# Patient Record
Sex: Male | Born: 1970 | Race: Black or African American | Hispanic: No | Marital: Single | Smoking: Light tobacco smoker
Health system: Southern US, Community
[De-identification: ages and names within clinical notes are randomized; demographics above are authoritative.]

## PROBLEM LIST (undated history)

## (undated) DIAGNOSIS — E119 Type 2 diabetes mellitus without complications: Secondary | ICD-10-CM

---

## 2020-01-25 ENCOUNTER — Emergency Department (HOSPITAL_COMMUNITY)
Admission: EM | Admit: 2020-01-25 | Discharge: 2020-01-25 | Disposition: A | Discharge status: Home / Self Care | Attending: Emergency Medicine | Admitting: Emergency Medicine

## 2020-01-25 ENCOUNTER — Other Ambulatory Visit: Payer: Self-pay

## 2020-01-25 ENCOUNTER — Emergency Department (HOSPITAL_COMMUNITY)

## 2020-01-25 ENCOUNTER — Encounter (HOSPITAL_COMMUNITY): Payer: Self-pay | Admitting: Emergency Medicine

## 2020-01-25 DIAGNOSIS — R079 Chest pain, unspecified: Secondary | ICD-10-CM | POA: Diagnosis not present

## 2020-01-25 DIAGNOSIS — Z5321 Procedure and treatment not carried out due to patient leaving prior to being seen by health care provider: Secondary | ICD-10-CM | POA: Diagnosis not present

## 2020-01-25 HISTORY — DX: Type 2 diabetes mellitus without complications: E11.9

## 2020-01-25 LAB — BASIC METABOLIC PANEL
Anion gap: 13 (ref 5–15)
BUN: 9 mg/dL (ref 6–20)
CO2: 23 mmol/L (ref 22–32)
Calcium: 9.2 mg/dL (ref 8.9–10.3)
Chloride: 99 mmol/L (ref 98–111)
Creatinine, Ser: 0.93 mg/dL (ref 0.61–1.24)
GFR calc Af Amer: >60 mL/min (ref >60)
GFR calc non Af Amer: >60 mL/min (ref >60)
Glucose, Bld: 285 mg/dL — ABNORMAL HIGH (ref 70–99)
Potassium: 3.4 mmol/L — ABNORMAL LOW (ref 3.5–5.1)
Sodium: 135 mmol/L (ref 135–145)

## 2020-01-25 LAB — CBC
HCT: 44.9 % (ref 39.0–52.0)
Hemoglobin: 14.6 g/dL (ref 13.0–17.0)
MCH: 26.7 pg (ref 26.0–34.0)
MCHC: 32.5 g/dL (ref 30.0–36.0)
MCV: 82.2 fL (ref 80.0–100.0)
Platelets: 300 10*3/uL (ref 150–400)
RBC: 5.46 MIL/uL (ref 4.22–5.81)
RDW: 13.6 % (ref 11.5–15.5)
WBC: 6 10*3/uL (ref 4.0–10.5)
nRBC: 0 % (ref 0.0–0.2)

## 2020-01-25 LAB — TROPONIN I (HIGH SENSITIVITY): Troponin I (High Sensitivity): 16 ng/L (ref <18)

## 2020-01-25 MED ORDER — SODIUM CHLORIDE 0.9% FLUSH: 3.0000 mL | Freq: Once | INTRAVENOUS | Status: DC

## 2020-01-25 NOTE — ED Notes (Signed)
Called pt 3 times and no one answered.

## 2020-01-25 NOTE — ED Triage Notes (Signed)
Pt arrives to ED after walking here about 5 blocks with c/o of CP for the last 2 weeks. Pt states this was new no history. Pt states he is currently living in halfway house and wanted to get checked.

## 2020-01-25 NOTE — ED Notes (Signed)
Called.

## 2020-01-26 ENCOUNTER — Encounter (HOSPITAL_COMMUNITY): Payer: Self-pay

## 2020-01-26 ENCOUNTER — Emergency Department (HOSPITAL_COMMUNITY)
Admission: EM | Admit: 2020-01-26 | Discharge: 2020-01-26 | Disposition: A | Discharge status: Home / Self Care | Attending: Emergency Medicine | Admitting: Emergency Medicine

## 2020-01-26 ENCOUNTER — Other Ambulatory Visit: Payer: Self-pay

## 2020-01-26 DIAGNOSIS — Z7982 Long term (current) use of aspirin: Secondary | ICD-10-CM | POA: Insufficient documentation

## 2020-01-26 DIAGNOSIS — Z7984 Long term (current) use of oral hypoglycemic drugs: Secondary | ICD-10-CM | POA: Diagnosis not present

## 2020-01-26 DIAGNOSIS — R739 Hyperglycemia, unspecified: Secondary | ICD-10-CM | POA: Diagnosis present

## 2020-01-26 DIAGNOSIS — E1165 Type 2 diabetes mellitus with hyperglycemia: Secondary | ICD-10-CM | POA: Diagnosis not present

## 2020-01-26 DIAGNOSIS — F17200 Nicotine dependence, unspecified, uncomplicated: Secondary | ICD-10-CM | POA: Insufficient documentation

## 2020-01-26 DIAGNOSIS — R519 Headache, unspecified: Secondary | ICD-10-CM | POA: Insufficient documentation

## 2020-01-26 LAB — CBC
HCT: 44.5 % (ref 39.0–52.0)
Hemoglobin: 14.4 g/dL (ref 13.0–17.0)
MCH: 27.1 pg (ref 26.0–34.0)
MCHC: 32.4 g/dL (ref 30.0–36.0)
MCV: 83.6 fL (ref 80.0–100.0)
Platelets: 238 10*3/uL (ref 150–400)
RBC: 5.32 MIL/uL (ref 4.22–5.81)
RDW: 13.5 % (ref 11.5–15.5)
WBC: 4 10*3/uL (ref 4.0–10.5)
nRBC: 0 % (ref 0.0–0.2)

## 2020-01-26 LAB — BASIC METABOLIC PANEL
Anion gap: 14 (ref 5–15)
BUN: 11 mg/dL (ref 6–20)
CO2: 28 mmol/L (ref 22–32)
Calcium: 9.6 mg/dL (ref 8.9–10.3)
Chloride: 96 mmol/L — ABNORMAL LOW (ref 98–111)
Creatinine, Ser: 0.9 mg/dL (ref 0.61–1.24)
GFR calc Af Amer: >60 mL/min (ref >60)
GFR calc non Af Amer: >60 mL/min (ref >60)
Glucose, Bld: 417 mg/dL — ABNORMAL HIGH (ref 70–99)
Potassium: 4.3 mmol/L (ref 3.5–5.1)
Sodium: 138 mmol/L (ref 135–145)

## 2020-01-26 LAB — URINALYSIS, ROUTINE W REFLEX MICROSCOPIC
Bacteria, UA: NONE SEEN
Bilirubin Urine: NEGATIVE
Glucose, UA: >=500 mg/dL — AB
Hgb urine dipstick: NEGATIVE
Ketones, ur: 20 mg/dL — AB
Leukocytes,Ua: NEGATIVE
Nitrite: NEGATIVE
Protein, ur: NEGATIVE mg/dL
Specific Gravity, Urine: 1.037 — ABNORMAL HIGH (ref 1.005–1.030)
pH: 6 (ref 5.0–8.0)

## 2020-01-26 LAB — CBG MONITORING, ED
Glucose-Capillary: 474 mg/dL — ABNORMAL HIGH (ref 70–99)
Glucose-Capillary: 271 mg/dL — ABNORMAL HIGH (ref 70–99)

## 2020-01-26 MED ORDER — BLOOD GLUCOSE MONITOR KIT: PACK | 0 refills | Status: AC

## 2020-01-26 MED ORDER — ATORVASTATIN CALCIUM 20 MG PO TABS: 20.0000 mg | ORAL_TABLET | Freq: Every day | ORAL | 0 refills | Status: DC

## 2020-01-26 MED ORDER — SODIUM CHLORIDE 0.9 % IV BOLUS
1000.0000 mL | Freq: Once | INTRAVENOUS | Status: AC
  Administered 2020-01-26: 1000 mL via INTRAVENOUS

## 2020-01-26 MED ORDER — METFORMIN HCL 1000 MG PO TABS: 1000.0000 mg | ORAL_TABLET | Freq: Every day | ORAL | 0 refills | Status: DC

## 2020-01-26 MED ORDER — ASPIRIN EC 81 MG PO TBEC: 81.0000 mg | DELAYED_RELEASE_TABLET | Freq: Every day | ORAL | 30 refills | Status: AC

## 2020-01-26 MED ORDER — LISINOPRIL 20 MG PO TABS: 20.0000 mg | ORAL_TABLET | Freq: Every day | ORAL | 0 refills | Status: DC

## 2020-01-26 NOTE — ED Provider Notes (Addendum)
[name] DEPT Provider Note   CSN: [id number] Arrival date & time: 01/26/20  1356     History Chief Complaint  Patient presents with  . Hyperglycemia    Rodney Williamson is a 49 y.o. male.  Pt presents to the ED today with hazy vision, feeling thirsty, and headache.  Pt was diagnosed with DM while in prison.  He was released with meds, but does not have insurance or a pcp.  Pt does not have a glucose monitor.  Pt denies f/c.  No sob.        Past Medical History:  Diagnosis Date  . Diabetes mellitus without complication ([place])     There are no problems to display for this patient.   History reviewed. No pertinent surgical history.     History reviewed. No pertinent family history.  Social History   Tobacco Use  . Smoking status: Light Tobacco Smoker  . Smokeless tobacco: Never Used  Substance Use Topics  . Alcohol use: Not on file  . Drug use: Not on file    Home Medications Prior to Admission medications   Medication Sig Start Date End Date Taking? Authorizing Provider  aspirin EC 81 MG tablet Take 1 tablet (81 mg total) by mouth daily. Swallow whole. 01/26/20   Isla Pence, MD  atorvastatin (LIPITOR) 20 MG tablet Take 1 tablet (20 mg total) by mouth daily. 01/26/20   Isla Pence, MD  blood glucose meter kit and supplies KIT Dispense based on patient and insurance preference. Use up to four times daily as directed. (FOR ICD-9 250.00, 250.01). 01/26/20   Isla Pence, MD  lisinopril (ZESTRIL) 20 MG tablet Take 1 tablet (20 mg total) by mouth daily. 01/26/20   Isla Pence, MD  metFORMIN (GLUCOPHAGE) 1000 MG tablet Take 1 tablet (1,000 mg total) by mouth daily. 01/26/20   Isla Pence, MD    Allergies    Patient has no known allergies.  Review of Systems   Review of Systems  Endocrine: Positive for polyuria.  All other systems reviewed and are negative.   Physical Exam Updated Vital Signs BP (!) 138/84 (BP  Location: Right Arm)   Pulse 101   Temp 99.1 F (37.3 C) (Oral)   Resp 16   Ht 5' 5" (1.651 m)   Wt 81.6 kg   SpO2 100%   BMI 29.95 kg/m   Physical Exam Vitals and nursing note reviewed.  Constitutional:      Appearance: Normal appearance.  HENT:     Head: Normocephalic and atraumatic.     Right Ear: External ear normal.     Left Ear: External ear normal.     Nose: Nose normal.     Mouth/Throat:     Mouth: Mucous membranes are dry.     Pharynx: Oropharynx is clear.  Eyes:     Extraocular Movements: Extraocular movements intact.     Conjunctiva/sclera: Conjunctivae normal.     Pupils: Pupils are equal, round, and reactive to light.  Cardiovascular:     Rate and Rhythm: Regular rhythm. Tachycardia present.     Pulses: Normal pulses.     Heart sounds: Normal heart sounds.  Pulmonary:     Effort: Pulmonary effort is normal.     Breath sounds: Normal breath sounds.  Abdominal:     General: Abdomen is flat. Bowel sounds are normal.     Palpations: Abdomen is soft.  Musculoskeletal:        General: Normal range of  motion.     Cervical back: Normal range of motion and neck supple.  Skin:    General: Skin is warm.     Capillary Refill: Capillary refill takes less than 2 seconds.  Neurological:     General: No focal deficit present.     Mental Status: He is alert and oriented to person, place, and time.  Psychiatric:        Mood and Affect: Mood normal.        Behavior: Behavior normal.        Thought Content: Thought content normal.        Judgment: Judgment normal.     ED Results / Procedures / Treatments   Labs (all labs ordered are listed, but only abnormal results are displayed) Labs Reviewed  BASIC METABOLIC PANEL - Abnormal; Notable for the following components:      Result Value   Chloride 96 (*)    Glucose, Bld 417 (*)    All other components within normal limits  URINALYSIS, ROUTINE W REFLEX MICROSCOPIC - Abnormal; Notable for the following components:     Color, Urine STRAW (*)    Specific Gravity, Urine 1.037 (*)    Glucose, UA >=500 (*)    Ketones, ur 20 (*)    All other components within normal limits  CBG MONITORING, ED - Abnormal; Notable for the following components:   Glucose-Capillary 474 (*)    All other components within normal limits  CBG MONITORING, ED - Abnormal; Notable for the following components:   Glucose-Capillary 271 (*)    All other components within normal limits  CBC  CBG MONITORING, ED  CBG MONITORING, ED    EKG None  Radiology DG Chest 2 View  Result Date: 01/25/2020 CLINICAL DATA:  Chest pain EXAM: CHEST - 2 VIEW COMPARISON:  None. FINDINGS: The heart size and mediastinal contours are within normal limits. Both lungs are clear. No pleural effusion. The visualized skeletal structures are unremarkable. IMPRESSION: No acute process in the chest. Electronically Signed   By: Macy Mis M.D.   On: 01/25/2020 15:41    Procedures Procedures (including critical care time)  Medications Ordered in ED Medications  sodium chloride 0.9 % bolus 1,000 mL (0 mLs Intravenous Stopped 01/26/20 1803)    ED Course  I have reviewed the triage vital signs and the nursing notes.  Pertinent labs & imaging results that were available during my care of the patient were reviewed by me and considered in my medical decision making (see chart for details).    MDM Rules/Calculators/A&P                         Pt is not in DKA.  BS is better after IVFs.  Pt is living at a halfway house and has not been able to eat a diabetic diet.  Pt did speak with the cook at the halfway house and she said she'd cook a diabetic diet if we told her what to cook.  Pt given a print out about food that is good to eat in DM.    Pt d/w SW/CM who will help arrange PCP and meds.    Pt given a rx for a glucometer.    Final Clinical Impression(s) / ED Diagnoses Final diagnoses:  Poorly controlled type 2 diabetes mellitus ([place])    Rx / DC  Orders ED Discharge Orders         Ordered    blood  glucose meter kit and supplies KIT     Discontinue  Reprint     01/26/20 1821    aspirin EC 81 MG tablet  Daily     Discontinue  Reprint     01/26/20 1827    atorvastatin (LIPITOR) 20 MG tablet  Daily     Discontinue  Reprint     01/26/20 1827    lisinopril (ZESTRIL) 20 MG tablet  Daily     Discontinue  Reprint     01/26/20 1827    metFORMIN (GLUCOPHAGE) 1000 MG tablet  Daily     Discontinue  Reprint     01/26/20 1827           Isla Pence, MD 01/26/20 Penni Bombard    Isla Pence, MD 01/26/20 [phone]

## 2020-01-26 NOTE — Progress Notes (Addendum)
TOC CM spoke to pt and states that he is still under federal parole. He is currently living in a [facility]. Provided pt with MATCH and explained he can use once per year with a $3 copay for meds. Pt states he can afford the $3 copay. Explained TOC CM will call him tomorrow with a follow up appt to see PCP. Pt states he is able to pay for his taxi ride to the ATM.  Jonnie Finner RN CCM, [facility] ED TOC CM [phone]   01/27/2020 146 pm TOC CM arranged appt for 02/01/2020 at 1:45 pm, contacted pt to provide information. Unable to leave a message. Did not have voicemail. Will try to contact pt again. [place], [place] ED TOC CM [phone]

## 2020-01-26 NOTE — ED Triage Notes (Signed)
Patient reports hazy vision yesterday, loss of appetite, coughing, thirsty, and headache.   Pt states he takes metformin  CBG in ED 400's  A/Ox4 Ambulatory in triage.   Hx: DM type 1 per patient

## 2020-01-26 NOTE — ED Notes (Signed)
Provided pt w/labeled specimen cup for urine collection. ENMiles 

## 2020-02-01 ENCOUNTER — Inpatient Hospital Stay (INDEPENDENT_AMBULATORY_CARE_PROVIDER_SITE_OTHER): Payer: Self-pay | Admitting: Primary Care

## 2020-02-08 ENCOUNTER — Other Ambulatory Visit (INDEPENDENT_AMBULATORY_CARE_PROVIDER_SITE_OTHER): Payer: Self-pay | Admitting: Primary Care

## 2020-02-08 ENCOUNTER — Encounter (INDEPENDENT_AMBULATORY_CARE_PROVIDER_SITE_OTHER): Payer: Self-pay | Admitting: Primary Care

## 2020-02-08 ENCOUNTER — Other Ambulatory Visit: Payer: Self-pay

## 2020-02-08 ENCOUNTER — Ambulatory Visit (INDEPENDENT_AMBULATORY_CARE_PROVIDER_SITE_OTHER): Payer: Self-pay | Admitting: Primary Care

## 2020-02-08 VITALS — BP 132/86 | HR 94 | Temp 98.0°F | Ht 65.0 in | Wt 174.8 lb

## 2020-02-08 DIAGNOSIS — I10 Essential (primary) hypertension: Secondary | ICD-10-CM

## 2020-02-08 DIAGNOSIS — Z114 Encounter for screening for human immunodeficiency virus [HIV]: Secondary | ICD-10-CM

## 2020-02-08 DIAGNOSIS — Z23 Encounter for immunization: Secondary | ICD-10-CM

## 2020-02-08 DIAGNOSIS — E1165 Type 2 diabetes mellitus with hyperglycemia: Secondary | ICD-10-CM

## 2020-02-08 DIAGNOSIS — Z7689 Persons encountering health services in other specified circumstances: Secondary | ICD-10-CM

## 2020-02-08 DIAGNOSIS — Z1159 Encounter for screening for other viral diseases: Secondary | ICD-10-CM

## 2020-02-08 DIAGNOSIS — Z131 Encounter for screening for diabetes mellitus: Secondary | ICD-10-CM

## 2020-02-08 LAB — POCT GLYCOSYLATED HEMOGLOBIN (HGB A1C): Hemoglobin A1C: 11.5 % — AB (ref 4.0–5.6)

## 2020-02-08 LAB — GLUCOSE, POCT (MANUAL RESULT ENTRY): POC Glucose: 193 mg/dl — AB (ref 70–99)

## 2020-02-08 MED ORDER — TRUE METRIX AIR GLUCOSE METER W/DEVICE KIT: 1.0000 | PACK | Freq: Once | 0 refills | Status: AC

## 2020-02-08 MED ORDER — METFORMIN HCL 1000 MG PO TABS: 1000.0000 mg | ORAL_TABLET | Freq: Two times a day (BID) | ORAL | 3 refills | Status: DC

## 2020-02-08 MED ORDER — LISINOPRIL 20 MG PO TABS: 20.0000 mg | ORAL_TABLET | Freq: Every day | ORAL | 1 refills | Status: DC

## 2020-02-08 MED ORDER — LANTUS SOLOSTAR 100 UNIT/ML ~~LOC~~ SOPN: 10.0000 [IU] | PEN_INJECTOR | Freq: Every day | SUBCUTANEOUS | 99 refills | Status: DC

## 2020-02-08 MED ORDER — TRUEPLUS LANCETS 28G MISC: 1.0000 | Freq: Three times a day (TID) | 3 refills | Status: AC

## 2020-02-08 MED FILL — !TRUE METRIX BLOOD GLUCOSE: 1 days supply | Qty: 1 | Fill #0

## 2020-02-08 MED FILL — LISINOPRIL 20 MG TABLET: 20 | 30 days supply | Qty: 30 | Fill #0

## 2020-02-08 MED FILL — !LANTUS SOLOSTAR 100UNITS/M: 100 | 30 days supply | Qty: 3 | Fill #0

## 2020-02-08 MED FILL — metFORMIN HCL 1000 MG TABS: 1000 | 30 days supply | Qty: 60 | Fill #0

## 2020-02-08 MED FILL — TRUEplus LANCETS 28G MISC: 25 days supply | Qty: 100 | Fill #0

## 2020-02-08 NOTE — Patient Instructions (Addendum)
Diabetes Basics  Diabetes (diabetes mellitus) is a long-term (chronic) disease. It occurs when the body does not properly use sugar (glucose) that is released from food after you eat. Diabetes may be caused by one or both of these problems:  Your pancreas does not make enough of a hormone called insulin.  Your body does not react in a normal way to insulin that it makes. Insulin lets sugars (glucose) go into cells in your body. This gives you energy. If you have diabetes, sugars cannot get into cells. This causes high blood sugar (hyperglycemia). Follow these instructions at home: How is diabetes treated? You may need to take insulin or other diabetes medicines daily to keep your blood sugar in balance. Take your diabetes medicines every day as told by your doctor. List your diabetes medicines here: Diabetes medicines  Name of medicine: ______________________________ ? Amount (dose): _______________ Time (a.m./p.m.): _______________ Notes: ___________________________________  Name of medicine: ______________________________ ? Amount (dose): _______________ Time (a.m./p.m.): _______________ Notes: ___________________________________  Name of medicine: ______________________________ ? Amount (dose): _______________ Time (a.m./p.m.): _______________ Notes: ___________________________________ If you use insulin, you will learn how to give yourself insulin by injection. You may need to adjust the amount based on the food that you eat. List the types of insulin you use here: Insulin  Insulin type: ______________________________ ? Amount (dose): _______________ Time (a.m./p.m.): _______________ Notes: ___________________________________  Insulin type: ______________________________ ? Amount (dose): _______________ Time (a.m./p.m.): _______________ Notes: ___________________________________  Insulin type: ______________________________ ? Amount (dose): _______________ Time (a.m./p.m.):  _______________ Notes: ___________________________________  Insulin type: ______________________________ ? Amount (dose): _______________ Time (a.m./p.m.): _______________ Notes: ___________________________________  Insulin type: ______________________________ ? Amount (dose): _______________ Time (a.m./p.m.): _______________ Notes: ___________________________________ How do I manage my blood sugar?  Check your blood sugar levels using a blood glucose monitor as directed by your doctor. Your doctor will set treatment goals for you. Generally, you should have these blood sugar levels:  Before meals (preprandial): 80-130 mg/dL (4.4-7.2 mmol/L).  After meals (postprandial): below 180 mg/dL (10 mmol/L).  A1c level: less than 7%. Write down the times that you will check your blood sugar levels: Blood sugar checks  Time: _______________ Notes: ___________________________________  Time: _______________ Notes: ___________________________________  Time: _______________ Notes: ___________________________________  Time: _______________ Notes: ___________________________________  Time: _______________ Notes: ___________________________________  Time: _______________ Notes: ___________________________________  What do I need to know about low blood sugar? Low blood sugar is called hypoglycemia. This is when blood sugar is at or below 70 mg/dL (3.9 mmol/L). Symptoms may include:  Feeling: ? Hungry. ? Worried or nervous (anxious). ? Sweaty and clammy. ? Confused. ? Dizzy. ? Sleepy. ? Sick to your stomach (nauseous).  Having: ? A fast heartbeat. ? A headache. ? A change in your vision. ? Tingling or no feeling (numbness) around the mouth, lips, or tongue. ? Jerky movements that you cannot control (seizure).  Having trouble with: ? Moving (coordination). ? Sleeping. ? Passing out (fainting). ? Getting upset easily (irritability). Treating low blood sugar To treat low blood  sugar, eat or drink something sugary right away. If you can think clearly and swallow safely, follow the 15:15 rule:  Take 15 grams of a fast-acting carb (carbohydrate). Talk with your doctor about how much you should take.  Some fast-acting carbs are: ? Sugar tablets (glucose pills). Take 3-4 glucose pills. ? 6-8 pieces of hard candy. ? 4-6 oz (120-150 mL) of fruit juice. ? 4-6 oz (120-150 mL) of regular (not diet) soda. ? 1 Tbsp (15 mL) honey or sugar.    Check your blood sugar 15 minutes after you take the carb.  If your blood sugar is still at or below 70 mg/dL (3.9 mmol/L), take 15 grams of a carb again.  If your blood sugar does not go above 70 mg/dL (3.9 mmol/L) after 3 tries, get help right away.  After your blood sugar goes back to normal, eat a meal or a snack within 1 hour. Treating very low blood sugar If your blood sugar is at or below 54 mg/dL (3 mmol/L), you have very low blood sugar (severe hypoglycemia). This is an emergency. Do not wait to see if the symptoms will go away. Get medical help right away. Call your local emergency services (911 in the [place] Do not drive yourself to the hospital. Questions to ask your health care provider  Do I need to meet with a diabetes educator?  What equipment will I need to care for myself at home?  What diabetes medicines do I need? When should I take them?  How often do I need to check my blood sugar?  What number can I call if I have questions?  When is my next doctor's visit?  Where can I find a support group for people with diabetes? Where to find more information  American Diabetes Association: www.diabetes.org  American Association of Diabetes Educators: www.diabeteseducator.org/patient-resources Contact a doctor if:  Your blood sugar is at or above 240 mg/dL (82.9 mmol/L) for 2 days in a row.  You have been sick or have had a fever for 2 days or more, and you are not getting better.  You have any of these  problems for more than 6 hours: ? You cannot eat or drink. ? You feel sick to your stomach (nauseous). ? You throw up (vomit). ? You have watery poop (diarrhea). Get help right away if:  Your blood sugar is lower than 54 mg/dL (3 mmol/L).  You get confused.  You have trouble: ? Thinking clearly. ? Breathing. Summary  Diabetes (diabetes mellitus) is a long-term (chronic) disease. It occurs when the body does not properly use sugar (glucose) that is released from food after digestion.  Take insulin and diabetes medicines as told.  Check your blood sugar every day, as often as told.  Keep all follow-up visits as told by your doctor. This is important. This information is not intended to replace advice given to you by your health care provider. Make sure you discuss any questions you have with your health care provider. Document Revised: 03/10/2019 Document Reviewed: 09/19/2017 Elsevier Patient Education  2020 [employer]. https://www.cdc.gov/vaccines/hcp/vis/vis-statements/tdap.pdf">  Tdap (Tetanus, Diphtheria, Pertussis) Vaccine: What You Need to Know 1. Why get vaccinated? Tdap vaccine can prevent tetanus, diphtheria, and pertussis. Diphtheria and pertussis spread from person to person. Tetanus enters the body through cuts or wounds.  TETANUS (T) causes painful stiffening of the muscles. Tetanus can lead to serious health problems, including being unable to open the mouth, having trouble swallowing and breathing, or death.  DIPHTHERIA (D) can lead to difficulty breathing, heart failure, paralysis, or death.  PERTUSSIS (aP), also known as "whooping cough," can cause uncontrollable, violent coughing which makes it hard to breathe, eat, or drink. Pertussis can be extremely serious in babies and young children, causing pneumonia, convulsions, brain damage, or death. In teens and adults, it can cause weight loss, loss of bladder control, passing out, and rib fractures from severe  coughing. 2. Tdap vaccine Tdap is only for children 7 years and older, adolescents, and adults.  Adolescents should receive a single dose of Tdap, preferably at age 54 or 12 years. Pregnant women should get a dose of Tdap during every pregnancy, to protect the newborn from pertussis. Infants are most at risk for severe, life-threatening complications from pertussis. Adults who have never received Tdap should get a dose of Tdap. Also, adults should receive a booster dose every 10 years, or earlier in the case of a severe and dirty wound or burn. Booster doses can be either Tdap or Td (a different vaccine that protects against tetanus and diphtheria but not pertussis). Tdap may be given at the same time as other vaccines. 3. Talk with your health care provider Tell your vaccine provider if the person getting the vaccine:  Has had an allergic reaction after a previous dose of any vaccine that protects against tetanus, diphtheria, or pertussis, or has any severe, life-threatening allergies.  Has had a coma, decreased level of consciousness, or prolonged seizures within 7 days after a previous dose of any pertussis vaccine (DTP, DTaP, or Tdap).  Has seizures or another nervous system problem.  Has ever had Guillain-Barr Syndrome (also called GBS).  Has had severe pain or swelling after a previous dose of any vaccine that protects against tetanus or diphtheria. In some cases, your health care provider may decide to postpone Tdap vaccination to a future visit.  People with minor illnesses, such as a cold, may be vaccinated. People who are moderately or severely ill should usually wait until they recover before getting Tdap vaccine.  Your health care provider can give you more information. 4. Risks of a vaccine reaction  Pain, redness, or swelling where the shot was given, mild fever, headache, feeling tired, and nausea, vomiting, diarrhea, or stomachache sometimes happen after Tdap  vaccine. People sometimes faint after medical procedures, including vaccination. Tell your provider if you feel dizzy or have vision changes or ringing in the ears.  As with any medicine, there is a very remote chance of a vaccine causing a severe allergic reaction, other serious injury, or death. 5. What if there is a serious problem? An allergic reaction could occur after the vaccinated person leaves the clinic. If you see signs of a severe allergic reaction (hives, swelling of the face and throat, difficulty breathing, a fast heartbeat, dizziness, or weakness), call 9-1-1 and get the person to the nearest hospital. For other signs that concern you, call your health care provider.  Adverse reactions should be reported to the Vaccine Adverse Event Reporting System (VAERS). Your health care provider will usually file this report, or you can do it yourself. Visit the VAERS website at www.vaers.LAgents.no or call (225)243-3899. VAERS is only for reporting reactions, and VAERS staff do not give medical advice. 6. The National Vaccine Injury Compensation Program The [employer] Vaccine Injury Compensation Program (VICP) is a federal program that was created to compensate people who may have been injured by certain vaccines. Visit the VICP website at SpiritualWord.at or call (859) 017-9514 to learn about the program and about filing a claim. There is a time limit to file a claim for compensation. 7. How can I learn more?  Ask your health care provider.  Call your local or state health department.  Contact the Centers for Disease Control and Prevention (CDC): ? Call 231 776 2585 (1-800-CDC-INFO) or ? Visit CDC's website at PicCapture.uy Vaccine Information Statement Tdap (Tetanus, Diphtheria, Pertussis) Vaccine (09/30/2018) This information is not intended to replace advice given to you by your health care provider. Make sure  you discuss any questions you have with your health care  provider. Document Revised: 10/09/2018 Document Reviewed: 10/12/2018 Elsevier Patient Education  2020 [employer].

## 2020-02-08 NOTE — Progress Notes (Signed)
Assessment and Plan: Rodney Williamson was seen today for hospitalization follow-up.  Diagnoses and all orders for this visit:  Encounter to establish care Rodney Mire, NP-C will be your  (PCP) she is mastered prepared . She is skilled to diagnosed and treat illness. Also able to answer health concern as well as continuing care of varied medical conditions, not limited by cause, organ system, or diagnosis.   Uncontrolled type 2 diabetes mellitus with hyperglycemia (HCC) : Goal of therapy: Less than 6.5 hemoglobin A1c.  Discussed complications with uncontrolled diabetes diabetic retinopathy, risk for amputations increased risk for stroke heart attack and renal failure. Monitor foods that are high in carbohydrates are the following rice, potatoes, breads, sugars, and pastas.  Reduction in the intake (eating) will assist in lowering your blood sugars.  Discussion with clinical pharmacist in regards to severity of diabetes and the need to be on insulin patient is to schedule with pharmacist for instructions on insulin administration -     HgB A1c -     Glucose (CBG) -     Lipid Panel -     insulin glargine (LANTUS SOLOSTAR) 100 UNIT/ML Solostar Pen; Inject 10 Units into the skin daily. -     metFORMIN (GLUCOPHAGE) 1000 MG tablet; Take 1 tablet (1,000 mg total) by mouth 2 (two) times daily with a meal.  Need for Tdap vaccination Tdap is recommended every 10 years for adults-     Tdap vaccine greater than or equal to 7yo IM  Essential hypertension Counseled on blood pressure goal of less than 130/80, low-sodium, DASH diet, medication compliance, 150 minutes of moderate intensity exercise per week.   Encounter for screening for HIV Health maintenance and care gaps -     HIV Antibody (routine testing w rflx)  Need for hepatitis C screening test Health maintenance and care gaps -     Hepatitis C Antibody  Other orders -     Blood Glucose Monitoring Suppl (TRUE METRIX AIR GLUCOSE METER) w/Device KIT; 1  Bag by Does not apply route once for 1 dose. -     TRUEplus Lancets 99I MISC; 1 applicator by Does not apply route 4 (four) times daily -  before meals and at bedtime. -     lisinopril (ZESTRIL) 20 MG tablet; Take 1 tablet (20 mg total) by mouth daily.     HPI Mr. Rodney Williamson y.o.male presents for follow up from 2 emergency room visits . Dates were 01/25/20 left with out being seen was told a 13 hr wait returned on ,  01/26/20, uncontrolled diabetes with A1C 11.5. Past Medical History:  Diagnosis Date  . Diabetes mellitus without complication ([place])      No Known Allergies    Current Outpatient Medications on File Prior to Visit  Medication Sig Dispense Refill  . aspirin EC 81 MG tablet Take 1 tablet (81 mg total) by mouth daily. Swallow whole. 30 tablet 30  . atorvastatin (LIPITOR) 20 MG tablet Take 1 tablet (20 mg total) by mouth daily. 30 tablet 0  . blood glucose meter kit and supplies KIT Dispense based on patient and insurance preference. Use up to four times daily as directed. (FOR ICD-9 250.00, 250.01). 1 each 0  . lisinopril (ZESTRIL) 20 MG tablet Take 1 tablet (20 mg total) by mouth daily. 30 tablet 0  . metFORMIN (GLUCOPHAGE) 1000 MG tablet Take 1 tablet (1,000 mg total) by mouth daily. 30 tablet 0   No current facility-administered medications on file  prior to visit.    ROS: all negative except above.   Physical Exam: Filed Weights   02/08/20 0933  Weight: 174 lb 12.8 oz (79.3 kg)   BP 132/86 (BP Location: Right Arm, Patient Position: Sitting, Cuff Size: Normal)   Pulse 94   Temp 98 F (36.7 C) (Oral)   Ht 5' 5" (1.651 m)   Wt 174 lb 12.8 oz (79.3 kg)   SpO2 93%   BMI 29.09 kg/m  General Appearance: Well nourished, in no apparent distress. Eyes: PERRLA, EOMs, conjunctiva no swelling or erythema Sinuses: No Frontal/maxillary tenderness ENT/Mouth: Ext aud canals clear, TMs without erythema, bulging. No erythema, swelling, or exudate on post pharynx.   Tonsils not swollen or erythematous. Hearing normal.  Neck: Supple, thyroid normal.  Respiratory: Respiratory effort normal, BS equal bilaterally without rales, rhonchi, wheezing or stridor.  Cardio: RRR with no MRGs. Brisk peripheral pulses without edema.  Abdomen: Soft, + BS.  Non tender, no guarding, rebound, hernias, masses. Lymphatics: Non tender without lymphadenopathy.  Musculoskeletal: Full ROM, 5/5 strength, normal gait.  Skin: Warm, dry without rashes, lesions, ecchymosis.  Neuro: Cranial nerves intact. Normal muscle tone, no cerebellar symptoms. Sensation intact.  Psych: Awake and oriented X 3, normal affect, Insight and Judgment appropriate.     Kerin Perna, NP 9:44 AM

## 2020-02-09 LAB — LIPID PANEL
Chol/HDL Ratio: 3.8 ratio (ref 0.0–5.0)
Cholesterol, Total: 148 mg/dL (ref 100–199)
HDL: 39 mg/dL — ABNORMAL LOW (ref >39)
LDL Chol Calc (NIH): 96 mg/dL (ref 0–99)
Triglycerides: 62 mg/dL (ref 0–149)
VLDL Cholesterol Cal: 13 mg/dL (ref 5–40)

## 2020-02-09 LAB — HIV ANTIBODY (ROUTINE TESTING W REFLEX): HIV Screen 4th Generation wRfx: NONREACTIVE

## 2020-02-09 LAB — HEPATITIS C ANTIBODY: Hep C Virus Ab: <0.1 s/co ratio (ref 0.0–0.9)

## 2020-02-09 NOTE — Progress Notes (Signed)
S:  Patient arrives in no acute distress.  Presents for diabetes evaluation, education, and management. Patient was last seen by Marcelino Duster to establish Primary Care and referred on 02/09/20. At that visit, pt was started on insulin.  Of note, pt was recently in ED on 01/26/20 for hyperglycemia.  T2DM diagnosed 4 years ago per pt.  Pertinent PMH: no pertinent positives Family/Social History:  - FHx: no pertinent positives - Tobacco: 2-4 cig/day, started recently when out of jail - Alcohol: denies  Insurance coverage/medication affordability: out of pocket   Current diabetes medications:  1. Metformin 1000 mg BID - takes only daily in PM 2. Lantus 10 units daily - new, has not yet started  Adverse effects: denies Medication adherence: denies  Prior diabetes med trials: none  Hypoglycemia: - Denies any episodes  Lifestyle: Diet: works third shift, 1-2 meals/day. lives in halfway house with cook - Breakfast: fruits - Dinner: started eating salads every other meal d/t fear of high BG,does not enjoy salads. Eating lots of fruits. Tries to avoid fried foods. Pt unsure of what to eat. - Drinks: cutting back on soda, drank some orange juice once but BG went to 500s so now avoids  Exercise: active at job including climbing stairs often, no other activity/exercise.  DM Comorbidities: - Nocturia: denies - Neuropathy: occurred once and resolved, none recently.  - last DFE unknown - Retinopathy: denies  - has not had recent eye exam - Gastroparesis: occasional feeling of early satiety   Current hypertension medications include: lisinopril 20 mg daily Current hyperlipidemia medications include: atorvastatin 20 mg daily  O:  A1c: 11.5 (02/08/20) POCT BG: 193 (02/08/20)  Home BG Readings None, does not have meter yet  Lipid Panel  LDL 96 (02/08/20)  Clinical Atherosclerotic Cardiovascular Disease (ASCVD): 10 year risk 22.7%  A: 1. Diabetes - Most recent A1c not at goal < 7%, next  due 05/2020 - Recent clinic BG not at goal - denies hypoglycemia episodes - tolerating current medication, no adherence concerns - pt is motivated to learn more about diabetes and improve diet - Education provided: insulin administration technique, hypoglycemia s/sx and Rule of 15s, rationale and mechanism for antihyperglycemic medications, MyPlate method and appropriate diabetes foods/drinks, provided MyPlate handout - Tobacco cessation: Short term goal reduce to 1-2 cig max per day, overall goal to quit completely  2. ASCVD: primary prevention - Last LDL above goal < 70 - On moderate intensity statin, consider high intensity given high ASCVD risk and elevated LDL - Pt is already on aspirin  P: - Continue metformin 1000 mg BID - Start Lantus 10 units daily at bedtime (already prescribed, educated today) - Check fasting BG daily, write down, bring to next visit - F/U Pharmacist Clinic Visit in 1 week  Total time in face to face counseling 50 minutes.    Laverna Peace, PharmD PGY-1 Ambulatory Care Pharmacy Resident 02/10/2020 11:05 AM  Butch Penny, PharmD, CPP Clinical Pharmacist [facility] & [facility] [phone]

## 2020-02-10 ENCOUNTER — Ambulatory Visit: Payer: Self-pay | Attending: Family Medicine | Admitting: Pharmacist

## 2020-02-10 ENCOUNTER — Other Ambulatory Visit: Payer: Self-pay

## 2020-02-10 DIAGNOSIS — E1165 Type 2 diabetes mellitus with hyperglycemia: Secondary | ICD-10-CM

## 2020-02-10 DIAGNOSIS — IMO0002 Reserved for concepts with insufficient information to code with codable children: Secondary | ICD-10-CM

## 2020-02-10 DIAGNOSIS — E118 Type 2 diabetes mellitus with unspecified complications: Secondary | ICD-10-CM

## 2020-02-10 MED ORDER — GLUCOSE BLOOD VI STRP
ORAL_STRIP | 2 refills | Status: AC
Start: 2020-02-10 — End: ?

## 2020-02-10 MED FILL — TRUE METRIX TEST STRIP: 50 days supply | Qty: 100 | Fill #0

## 2020-02-11 ENCOUNTER — Encounter: Payer: Self-pay | Admitting: Pharmacist

## 2020-02-11 LAB — GLUCOSE, POCT (MANUAL RESULT ENTRY): POC Glucose: 193 mg/dl — AB (ref 70–99)

## 2020-02-11 NOTE — Addendum Note (Signed)
Addended by: Lois Huxley, Jeannett Senior L on: 02/11/2020 10:21 AM   Modules accepted: Orders

## 2020-02-21 NOTE — Progress Notes (Signed)
  S:  Patient arrives in no acute distress.  Presents for diabetes evaluation, education, and management. Patient was last seen by Marcelino Duster to establish Primary Care and referred on 02/09/20. At that visit, pt was prescribed insulin but had not yet started.   Patient last seen by Clinical Pharmacist on 02/10/20, pt educated and instructed to start insulin.  T2DM diagnosed 4 years ago per pt.   Pertinent PMH: no pertinent positives Family/Social History: - FHx: no pertinent positives - Tobacco: 5-6 cig/day, started recently when out of jail, states he smokes the most on work breaks  - Alcohol: denies  [employer] affordability: out of pocket   Current diabetes medications:  1. Metformin 1000 mg BID - patient continues to only be taking once daily 2. Lantus 10 units daily  Adverse effects: denies Medication adherence: denies missed doses Prior diabetes med trials: none  Hypoglycemia: - Denies any episodes  Lifestyle: Diet: works third shift, 1-2 meals/day. lives in halfway house with cook. Provided MyPlate handout at last visit. - improved except on the weekends due to change in chief (not as many healthy options provided)  Exercise: active at job including climbing stairs often, no other activity/exercise.  DM Comorbidities: - Nocturia: denies - Neuropathy: occurred once and resolved, none recently.  - last DFE unknown - Retinopathy: denies             - has not had recent eye exam - Gastroparesis: occasional feeling of early satiety   Current hypertension medications include: lisinopril 20 mg daily Current hyperlipidemia medications include: atorvastatin 20 mg daily  O:  A1c: 11.5 (02/08/20) POCT FBG: 144 (02/22/20)  Home BG Readings None, does not have meter yet  Lipid Panel  LDL 96 (02/08/20)  Clinical Atherosclerotic Cardiovascular Disease (ASCVD): 10 year risk 22.7%  A: 1. Diabetes - Most recent A1c not at goal < 7%, next due  05/2020 - Recent clinic BG not at goal - denies hypoglycemia episodes - tolerating current medication, no adherence concerns  - Tobacco cessation: Short term goal reduce to 1-2 cig max per day, overall goal to quit completely. Patient states today that cig use has increased to 5-6 cigs/day (specifically during work breaks)  2. ASCVD: primary prevention - Last LDL above goal < 70 -Currently on moderate intensity statin; discussed benefits of switching to a high intensity given high ASCVD risk and elevated LDL. Patient agreed to this change.  - Will increase atorvastatin today to high intensity dose - Pt is already on aspirin  P:  - Continue metformin 1000 mg BID - counseled patient to take BID - Increased Lantus to 13 units daily at bedtime - Increase atorvastatin to 40 mg daily - F/U Pharmacist Clinic Visit in 2-3 weeks  Total time in face to face counseling 20 minutes.    Theodis Sato, PharmD PGY-1 Community Pharmacy Resident   02/22/2020 12:07 PM   Butch Penny, PharmD, CPP Clinical Pharmacist [facility] & [facility] [phone]

## 2020-02-22 ENCOUNTER — Other Ambulatory Visit: Payer: Self-pay

## 2020-02-22 ENCOUNTER — Ambulatory Visit: Payer: Self-pay | Attending: Primary Care | Admitting: Pharmacist

## 2020-02-22 ENCOUNTER — Encounter: Payer: Self-pay | Admitting: Pharmacist

## 2020-02-22 ENCOUNTER — Other Ambulatory Visit: Payer: Self-pay | Admitting: Family Medicine

## 2020-02-22 DIAGNOSIS — E1165 Type 2 diabetes mellitus with hyperglycemia: Secondary | ICD-10-CM

## 2020-02-22 LAB — GLUCOSE, POCT (MANUAL RESULT ENTRY): POC Glucose: 144 mg/dl — AB (ref 70–99)

## 2020-02-22 MED ORDER — ATORVASTATIN CALCIUM 40 MG PO TABS
40.0000 mg | ORAL_TABLET | Freq: Every day | ORAL | 2 refills | Status: DC
Start: 2020-02-22 — End: 2020-05-10

## 2020-02-22 MED ORDER — LANTUS SOLOSTAR 100 UNIT/ML ~~LOC~~ SOPN: 13.0000 [IU] | PEN_INJECTOR | Freq: Every day | SUBCUTANEOUS | 99 refills | Status: DC

## 2020-02-23 MED FILL — ATORVASTATIN CALCIUM 40 MG: 40 | 30 days supply | Qty: 30 | Fill #0

## 2020-03-08 MED FILL — !LANTUS SOLOSTAR 100UNITS/M: 100 | 28 days supply | Qty: 3 | Fill #1

## 2020-03-14 ENCOUNTER — Other Ambulatory Visit: Payer: Self-pay

## 2020-03-14 ENCOUNTER — Encounter: Payer: Self-pay | Admitting: Pharmacist

## 2020-03-14 ENCOUNTER — Ambulatory Visit: Payer: Self-pay | Attending: Primary Care | Admitting: Pharmacist

## 2020-03-14 DIAGNOSIS — E1165 Type 2 diabetes mellitus with hyperglycemia: Secondary | ICD-10-CM

## 2020-03-14 LAB — GLUCOSE, POCT (MANUAL RESULT ENTRY): POC Glucose: 101 mg/dl — AB (ref 70–99)

## 2020-03-14 MED FILL — LISINOPRIL 20 MG TABLET: 20 | 30 days supply | Qty: 30 | Fill #1

## 2020-03-14 NOTE — Progress Notes (Signed)
  S:  PCP: Michelle   Patient arrives in good spirits. Presents for diabetes evaluation, education, and management. Patient was last seen by pharmacy team on 02/22/20, originally referred by [facility]. At that visit, pt was prescribed insulin but had not yet started.   Patient last seen by Clinical Pharmacist on 02/22/20, lantus was increased to 13 units daily, counseled on proper administration of metformin with twice daily dosing, and increased atorvastatin to 40 mg.  T2DM diagnosed 4 years ago per pt.   Pertinent PMH: no pertinent positives Family/Social History: - FHx: no pertinent positives - Tobacco: ~5 cigs/day, typically during work breaks - Alcohol: denies  [employer] affordability: out of pocket   Current diabetes medications:  1. Metformin 1000 mg BID 2. Lantus 13 units daily  Adverse effects: denies Medication adherence: denies missed doses Prior diabetes med trials: none  Hypoglycemia: - Denies any episodes  Lifestyle: Diet: works third shift, 1-2 meals/day. lives in halfway house with cook. Provided MyPlate handout at last visit. - diet has improved greatly, increased intake of veggies, fruits and salads  Exercise: active at job including climbing stairs often, no other activity/exercise.  DM Comorbidities: - Nocturia: denies - Neuropathy: occurred once and resolved, none recently.  - Retinopathy: denies             - has not had recent eye exam - Gastroparesis: occasional feeling of early satiety   Current hypertension medications include: lisinopril 20 mg daily Current hyperlipidemia medications include: atorvastatin 20 mg daily  O:  A1c: 11.5 (02/08/20) POCT FBG: 101 (03/14/20)  Home BG Readings Fasting has been ~90 (subjective)  Lipid Panel  LDL 96 (02/08/20)  Clinical Atherosclerotic Cardiovascular Disease (ASCVD): 10 year risk 22.7%  A: 1. Diabetes - Most recent A1c not at goal < 7%, next due 05/2020 - FBG today  at 101; meets goal - denies hypoglycemia episodes - tolerating current medication, no adherence concerns  - Tobacco cessation: Short term goal reduce to 1-2 cig max per day, overall goal to quit completely. Patient states he continues to be ~5 cigs/day, some days may be less  2. ASCVD: primary prevention - Last LDL above goal < 70 -Given high ASCVD risk and elevated LDL. Atorvastatin was increased to 40 mg at last visit.  - Pt is already on aspirin  P:  - Continue metformin 1000 mg BID  - Continue Lantus 13 units daily at bedtime - Continue atorvastatin to 40 mg daily - F/U Pharmacist Clinic Visit in ~1 month - Pt has appt with Marcelino Duster in November for F/U and repeat A1c  Total time in face to face counseling 20 minutes.    Theodis Sato, PharmD PGY-1 Community Pharmacy Resident  03/14/2020 4:51 PM   Butch Penny, PharmD, CPP Clinical Pharmacist [facility] & [facility] [phone]

## 2020-04-12 ENCOUNTER — Other Ambulatory Visit: Payer: Self-pay

## 2020-04-12 ENCOUNTER — Encounter: Payer: Self-pay | Admitting: Pharmacist

## 2020-04-12 ENCOUNTER — Ambulatory Visit: Payer: Self-pay | Attending: Primary Care | Admitting: Pharmacist

## 2020-04-12 DIAGNOSIS — E1165 Type 2 diabetes mellitus with hyperglycemia: Secondary | ICD-10-CM

## 2020-04-12 DIAGNOSIS — E118 Type 2 diabetes mellitus with unspecified complications: Secondary | ICD-10-CM

## 2020-04-12 DIAGNOSIS — IMO0002 Reserved for concepts with insufficient information to code with codable children: Secondary | ICD-10-CM | POA: Insufficient documentation

## 2020-04-12 LAB — GLUCOSE, POCT (MANUAL RESULT ENTRY): POC Glucose: 103 mg/dl — AB (ref 70–99)

## 2020-04-12 MED FILL — LANTUS SOLOSTAR 100 UNITS/M: 100 | 23 days supply | Qty: 3 | Fill #0

## 2020-04-12 MED FILL — METFORMIN HCL 1000 MG TABS: 1000 | 30 days supply | Qty: 60 | Fill #1

## 2020-04-12 NOTE — Progress Notes (Signed)
S:     PCP: Gwinda Passe, NP  Patient arrives in good spirits accompanied with family member. Presents for diabetes evaluation, education, and management.  Patient was referred and last seen by Primary Care Provider on 02/08/2020. Last seen by clinical pharmacist on 03/14/20 in which no medications changes were made.   Patient reports Diabetes was diagnosed 4 years ago.   Today, patient reports medication adherence with metformin 1000 mg BID and Lantus 13 units daily. Denies any side effects with medications. Reports home sugars range from 90-100 fasting and does not check evening sugars. Denies sugars <70 mg/dL. Additionally, reports trying to cut back on smoking; currently smoking 5 cigarettes/day.   Family/Social History:  -FHx: no pertinent positives -Tobacco: ~5 cigs/day -Alcohol: denies  Insurance coverage/medication affordability: out of pocket  Medication adherence reported.   Current diabetes medications include: Metformin 1000 mg BID, Lantus 13 units daily Current hypertension medications include: Lisinopril 20 mg daily Current hyperlipidemia medications include: Atorvastatin 40 mg daily  Patient denies hypoglycemic events.  Patient reported dietary habits: Eats 1-2 meals/day Dinner:Salads, fruits vegetables Drinks:Water, occasional poweraid  Patient-reported exercise habits: active at job including climbing stairs often   Patient denies nocturia (nighttime urination).  Patient denies neuropathy (nerve pain). Occurred once and resolved, none recently. Patient denies visual changes.    O:  POCT: 103 (post-prandial)  Lab Results  Component Value Date   HGBA1C 11.5 (A) 02/08/2020   There were no vitals filed for this visit.  Lipid Panel     Component Value Date/Time   CHOL 148 02/08/2020 1029   TRIG 62 02/08/2020 1029   HDL 39 (L) 02/08/2020 1029   CHOLHDL 3.8 02/08/2020 1029   LDLCALC 96 02/08/2020 1029    Home fasting blood sugars: 90-100  (subjective) 2 hour post-meal/random blood sugars: Not taking.   Clinical Atherosclerotic Cardiovascular Disease (ASCVD): No  The 10-year ASCVD risk score Denman George DC Jr., et al., 2013) is: 23.8%   Values used to calculate the score:     Age: 54 years     Sex: Male     Is Non-Hispanic African American: Yes     Diabetic: Yes     Tobacco smoker: Yes     Systolic Blood Pressure: 132 mmHg     Is BP treated: Yes     HDL Cholesterol: 39 mg/dL     Total Cholesterol: 148 mg/dL    A/P:  Diabetes currently uncontrolled with A1c of 11.5%, however home sugars have significantly improved. Pt denies hypoglycemia episodes. Medication adherence appears optimal. Advised patient to start checking blood sugar readings in the evening after meals to better assess post-prandial sugars and to bring glucometer with him to follow up visit with PCP. Also, discussed BG goals. Patient verbalized understanding. Will not make any medication changes and will continue current regimen. Encouraged patient to continue eating a healthy diet and to exercise with the goal of 150 mins/week.  -Continued Lantus 13 units daily in the evenings -Continued metformin 1000 mg BID -Urine sample collected to assess for UACR.  -Extensively discussed pathophysiology of diabetes, recommended lifestyle interventions, dietary effects on blood sugar control -Next A1C anticipated 05/2020.   Smoking cessation: -Patient endorsed wanting to quit smoking, however not interested in nicotine replacement therapy at this time.   ASCVD risk - primary prevention in patient with diabetes. Last LDL is not controlled. ASCVD risk score is >20%  - high intensity statin indicated. Aspirin is indicated.  -Continued aspirin 81 mg  -Continued  Atorvstatin 40 mg.    Written patient instructions provided.  Total time in face to face counseling 20 minutes.   Follow up with PCP Gwinda Passe on 05/10/2020.  Patient seen with:  Levada Schilling, PharmD  Candidate [facility] [employer]  Fabio Neighbors, PharmD PGY2 Ambulatory Care Resident [facility]  Pharmacy

## 2020-04-13 LAB — MICROALBUMIN / CREATININE URINE RATIO
Creatinine, Urine: 122.5 mg/dL
Microalb/Creat Ratio: 5 mg/g creat (ref 0–29)
Microalbumin, Urine: 5.8 ug/mL

## 2020-04-14 ENCOUNTER — Telehealth (INDEPENDENT_AMBULATORY_CARE_PROVIDER_SITE_OTHER): Payer: Self-pay

## 2020-04-14 NOTE — Telephone Encounter (Signed)
Patient confirmed date of birth. He is aware that micro albumin is normal. Maryjean Morn, CMA

## 2020-04-14 NOTE — Telephone Encounter (Signed)
-----   Message from Grayce Sessions, NP sent at 04/14/2020  8:43 AM EDT ----- Micro albumin normal

## 2020-05-10 ENCOUNTER — Other Ambulatory Visit: Payer: Self-pay

## 2020-05-10 ENCOUNTER — Encounter (INDEPENDENT_AMBULATORY_CARE_PROVIDER_SITE_OTHER): Payer: Self-pay | Admitting: Primary Care

## 2020-05-10 ENCOUNTER — Ambulatory Visit (INDEPENDENT_AMBULATORY_CARE_PROVIDER_SITE_OTHER): Payer: Self-pay | Admitting: Primary Care

## 2020-05-10 VITALS — BP 142/81 | HR 89 | Temp 98.6°F | Ht 65.0 in | Wt 164.4 lb

## 2020-05-10 DIAGNOSIS — I10 Essential (primary) hypertension: Secondary | ICD-10-CM

## 2020-05-10 DIAGNOSIS — Z760 Encounter for issue of repeat prescription: Secondary | ICD-10-CM

## 2020-05-10 DIAGNOSIS — E1165 Type 2 diabetes mellitus with hyperglycemia: Secondary | ICD-10-CM

## 2020-05-10 DIAGNOSIS — E782 Mixed hyperlipidemia: Secondary | ICD-10-CM

## 2020-05-10 LAB — POCT GLYCOSYLATED HEMOGLOBIN (HGB A1C): Hemoglobin A1C: 6.6 % — AB (ref 4.0–5.6)

## 2020-05-10 LAB — GLUCOSE, POCT (MANUAL RESULT ENTRY): POC Glucose: 93 mg/dl (ref 70–99)

## 2020-05-10 MED ORDER — METFORMIN HCL 500 MG PO TABS: 500.0000 mg | ORAL_TABLET | Freq: Two times a day (BID) | ORAL | 1 refills | Status: AC

## 2020-05-10 MED ORDER — LANTUS SOLOSTAR 100 UNIT/ML ~~LOC~~ SOPN: 13.0000 [IU] | PEN_INJECTOR | Freq: Every day | SUBCUTANEOUS | 99 refills | Status: AC

## 2020-05-10 MED ORDER — ATORVASTATIN CALCIUM 40 MG PO TABS
40.0000 mg | ORAL_TABLET | Freq: Every day | ORAL | 0 refills | Status: DC
Start: 2020-05-10 — End: 2020-05-11

## 2020-05-10 MED ORDER — LISINOPRIL 20 MG PO TABS: 20.0000 mg | ORAL_TABLET | Freq: Every day | ORAL | 1 refills | Status: AC

## 2020-05-10 MED FILL — !LANTUS SOLOSTAR 100UNITS/M: 100 | 23 days supply | Qty: 3 | Fill #0

## 2020-05-10 MED FILL — LISINOPRIL 20 MG TABLET: 20 | 30 days supply | Qty: 30 | Fill #0

## 2020-05-10 MED FILL — METFORMIN HCL 500 MG TABS: 500 | 30 days supply | Qty: 60 | Fill #0

## 2020-05-10 MED FILL — ATORVASTATIN CALCIUM 40 MG: 40 | 30 days supply | Qty: 30 | Fill #0

## 2020-05-10 NOTE — Patient Instructions (Signed)
Diabetes Mellitus and Exercise Exercising regularly is important for your overall health, especially when you have diabetes (diabetes mellitus). Exercising is not only about losing weight. It has many other health benefits, such as increasing muscle strength and bone density and reducing body fat and stress. This leads to improved fitness, flexibility, and endurance, all of which result in better overall health. Exercise has additional benefits for people with diabetes, including:  Reducing appetite.  Helping to lower and control blood glucose.  Lowering blood pressure.  Helping to control amounts of fatty substances (lipids) in the blood, such as cholesterol and triglycerides.  Helping the body to respond better to insulin (improving insulin sensitivity).  Reducing how much insulin the body needs.  Decreasing the risk for heart disease by: ? Lowering cholesterol and triglyceride levels. ? Increasing the levels of good cholesterol. ? Lowering blood glucose levels. What is my activity plan? Your health care provider or certified diabetes educator can help you make a plan for the type and frequency of exercise (activity plan) that works for you. Make sure that you:  Do at least 150 minutes of moderate-intensity or vigorous-intensity exercise each week. This could be brisk walking, biking, or water aerobics. ? Do stretching and strength exercises, such as yoga or weightlifting, at least 2 times a week. ? Spread out your activity over at least 3 days of the week.  Get some form of physical activity every day. ? Do not go more than 2 days in a row without some kind of physical activity. ? Avoid being inactive for more than 30 minutes at a time. Take frequent breaks to walk or stretch.  Choose a type of exercise or activity that you enjoy, and set realistic goals.  Start slowly, and gradually increase the intensity of your exercise over time. What do I need to know about managing my  diabetes?   Check your blood glucose before and after exercising. ? If your blood glucose is 240 mg/dL (13.3 mmol/L) or higher before you exercise, check your urine for ketones. If you have ketones in your urine, do not exercise until your blood glucose returns to normal. ? If your blood glucose is 100 mg/dL (5.6 mmol/L) or lower, eat a snack containing 15-20 grams of carbohydrate. Check your blood glucose 15 minutes after the snack to make sure that your level is above 100 mg/dL (5.6 mmol/L) before you start your exercise.  Know the symptoms of low blood glucose (hypoglycemia) and how to treat it. Your risk for hypoglycemia increases during and after exercise. Common symptoms of hypoglycemia can include: ? Hunger. ? Anxiety. ? Sweating and feeling clammy. ? Confusion. ? Dizziness or feeling light-headed. ? Increased heart rate or palpitations. ? Blurry vision. ? Tingling or numbness around the mouth, lips, or tongue. ? Tremors or shakes. ? Irritability.  Keep a rapid-acting carbohydrate snack available before, during, and after exercise to help prevent or treat hypoglycemia.  Avoid injecting insulin into areas of the body that are going to be exercised. For example, avoid injecting insulin into: ? The arms, when playing tennis. ? The legs, when jogging.  Keep records of your exercise habits. Doing this can help you and your health care provider adjust your diabetes management plan as needed. Write down: ? Food that you eat before and after you exercise. ? Blood glucose levels before and after you exercise. ? The type and amount of exercise you have done. ? When your insulin is expected to peak, if you use   insulin. Avoid exercising at times when your insulin is peaking.  When you start a new exercise or activity, work with your health care provider to make sure the activity is safe for you, and to adjust your insulin, medicines, or food intake as needed.  Drink plenty of water while  you exercise to prevent dehydration or heat stroke. Drink enough fluid to keep your urine clear or pale yellow. Summary  Exercising regularly is important for your overall health, especially when you have diabetes (diabetes mellitus).  Exercising has many health benefits, such as increasing muscle strength and bone density and reducing body fat and stress.  Your health care provider or certified diabetes educator can help you make a plan for the type and frequency of exercise (activity plan) that works for you.  When you start a new exercise or activity, work with your health care provider to make sure the activity is safe for you, and to adjust your insulin, medicines, or food intake as needed. This information is not intended to replace advice given to you by your health care provider. Make sure you discuss any questions you have with your health care provider. Document Revised: 01/09/2017 Document Reviewed: 11/27/2015 Elsevier Patient Education  2020 [employer]  

## 2020-05-11 ENCOUNTER — Other Ambulatory Visit (INDEPENDENT_AMBULATORY_CARE_PROVIDER_SITE_OTHER): Payer: Self-pay | Admitting: Primary Care

## 2020-05-11 LAB — LIPID PANEL
Chol/HDL Ratio: 2.9 ratio (ref 0.0–5.0)
Cholesterol, Total: 147 mg/dL (ref 100–199)
HDL: 50 mg/dL (ref >39)
LDL Chol Calc (NIH): 82 mg/dL (ref 0–99)
Triglycerides: 74 mg/dL (ref 0–149)
VLDL Cholesterol Cal: 15 mg/dL (ref 5–40)

## 2020-05-11 MED ORDER — ATORVASTATIN CALCIUM 20 MG PO TABS: 20.0000 mg | ORAL_TABLET | Freq: Every day | ORAL | 1 refills | Status: AC

## 2020-05-12 ENCOUNTER — Telehealth (INDEPENDENT_AMBULATORY_CARE_PROVIDER_SITE_OTHER): Payer: Self-pay

## 2020-05-12 NOTE — Telephone Encounter (Signed)
Patient verified date of birth. He is aware that labs are normal. Atorvastatin decreased from 40 mg to 20 mg. Advised patient to continue taking to decrease risk of stroke and heart attack with diagnosis of diabetes. Maryjean Morn, CMA

## 2020-05-12 NOTE — Telephone Encounter (Signed)
-----   Message from Grayce Sessions, NP sent at 05/11/2020  4:18 PM EST ----- All labs are normal decreased your atorvastatin 40 mg to 20 mg at bedtime.  Continue to take to decrease risk of heart attack and stroke with diagnosis of diabetes

## 2020-05-13 NOTE — Progress Notes (Signed)
Established Patient Office Visit  Subjective:  Patient ID: Rodney Williamson, male    DOB: [birth year]  Age: 49 y.o. MRN: [id number]  CC:  Chief Complaint  Patient presents with  . Follow-up    diabetes   . Medication Refill    insulin    HPI Mr.Rodney Williamson is a 49 year old male presents for management type 2 diabetes denies polyuria, polyphagia and polydipsia. Hypertension elevated Bp Denies shortness of breath, headaches, chest pain or lower extremity edema. Requesting medication refills. No medication knew he would need labs  Past Medical History:  Diagnosis Date  . Diabetes mellitus without complication ([place])     History reviewed. No pertinent surgical history.  History reviewed. No pertinent family history.  Social History   Socioeconomic History  . Marital status: Single    Spouse name: Not on file  . Number of children: Not on file  . Years of education: Not on file  . Highest education level: Not on file  Occupational History  . Not on file  Tobacco Use  . Smoking status: Light Tobacco Smoker  . Smokeless tobacco: Never Used  . Tobacco comment: has not smoked in about 2 weeks   Substance and Sexual Activity  . Alcohol use: Not on file  . Drug use: Not on file  . Sexual activity: Not on file  Other Topics Concern  . Not on file  Social History Narrative  . Not on file   Social Determinants of Health   Financial Resource Strain:   . Difficulty of Paying Living Expenses: Not on file  Food Insecurity:   . Worried About Charity fundraiser in the Last Year: Not on file  . Ran Out of Food in the Last Year: Not on file  Transportation Needs:   . Lack of Transportation (Medical): Not on file  . Lack of Transportation (Non-Medical): Not on file  Physical Activity:   . Days of Exercise per Week: Not on file  . Minutes of Exercise per Session: Not on file  Stress:   . Feeling of Stress : Not on file  Social Connections:   . Frequency of  Communication with Friends and Family: Not on file  . Frequency of Social Gatherings with Friends and Family: Not on file  . Attends Religious Services: Not on file  . Active Member of Clubs or Organizations: Not on file  . Attends Archivist Meetings: Not on file  . Marital Status: Not on file  Intimate Partner Violence:   . Fear of Current or Ex-Partner: Not on file  . Emotionally Abused: Not on file  . Physically Abused: Not on file  . Sexually Abused: Not on file    Outpatient Medications Prior to Visit  Medication Sig Dispense Refill  . aspirin EC 81 MG tablet Take 1 tablet (81 mg total) by mouth daily. Swallow whole. 30 tablet 30  . blood glucose meter kit and supplies KIT Dispense based on patient and insurance preference. Use up to four times daily as directed. (FOR ICD-9 250.00, 250.01). 1 each 0  . glucose blood test strip Use as instructed to check blood sugar twice daily. 100 each 2  . TRUEplus Lancets 77N MISC 1 applicator by Does not apply route 4 (four) times daily -  before meals and at bedtime. 100 each 3  . atorvastatin (LIPITOR) 40 MG tablet Take 1 tablet (40 mg total) by mouth daily. 30 tablet 2  . insulin glargine (  LANTUS SOLOSTAR) 100 UNIT/ML Solostar Pen Inject 13 Units into the skin daily. 15 mL PRN  . lisinopril (ZESTRIL) 20 MG tablet Take 1 tablet (20 mg total) by mouth daily. 90 tablet 1  . metFORMIN (GLUCOPHAGE) 1000 MG tablet Take 1 tablet (1,000 mg total) by mouth 2 (two) times daily with a meal. 180 tablet 3   No facility-administered medications prior to visit.    No Known Allergies  ROS Review of Systems  All other systems reviewed and are negative.     Objective:    BP (!) 142/81 (BP Location: Right Arm, Patient Position: Sitting, Cuff Size: Normal)   Pulse 89   Temp 98.6 F (37 C) (Temporal)   Ht _0  (1.651 m)   Wt 164 lb 6.4 oz (74.6 kg)   SpO2 95%   BMI 27.36 kg/m  Wt Readings from Last 3 Encounters:  05/10/20 164 lb 6.4  oz (74.6 kg)  02/08/20 174 lb 12.8 oz (79.3 kg)  01/26/20 180 lb (81.6 kg)   Physical Exam Vitals reviewed.  HENT:     Head: Normocephalic.     Right Ear: Tympanic membrane normal.     Left Ear: Tympanic membrane normal.     Nose: Nose normal.  Cardiovascular:     Rate and Rhythm: Normal rate and regular rhythm.  Pulmonary:     Effort: Pulmonary effort is normal.     Breath sounds: Normal breath sounds.  Abdominal:     General: Bowel sounds are normal.     Palpations: Abdomen is soft.  Musculoskeletal:        General: Normal range of motion.     Cervical back: Normal range of motion.  Skin:    General: Skin is warm and dry.  Neurological:     Mental Status: He is alert and oriented to person, place, and time.  Psychiatric:        Mood and Affect: Mood normal.        Behavior: Behavior normal.        Thought Content: Thought content normal.        Judgment: Judgment normal.    Health Maintenance Due  Topic Date Due  . OPHTHALMOLOGY EXAM  Never done  . COVID-19 Vaccine (1) Never done    There are no preventive care reminders to display for this patient.  No results found for: TSH Lab Results  Component Value Date   WBC 4.0 01/26/2020   HGB 14.4 01/26/2020   HCT 44.5 01/26/2020   MCV 83.6 01/26/2020   PLT 238 01/26/2020   Lab Results  Component Value Date   NA 138 01/26/2020   K 4.3 01/26/2020   CO2 28 01/26/2020   GLUCOSE 417 (H) 01/26/2020   BUN 11 01/26/2020   CREATININE 0.90 01/26/2020   CALCIUM 9.6 01/26/2020   ANIONGAP 14 01/26/2020   Lab Results  Component Value Date   CHOL 147 05/10/2020   Lab Results  Component Value Date   HDL 50 05/10/2020   Lab Results  Component Value Date   LDLCALC 82 05/10/2020   Lab Results  Component Value Date   TRIG 74 05/10/2020   Lab Results  Component Value Date   CHOLHDL 2.9 05/10/2020   Lab Results  Component Value Date   HGBA1C 6.6 (A) 05/10/2020     Assessment & Plan:  Rodney Williamson was seen today  for follow-up and medication refill.  Diagnoses and all orders for this visit:  Uncontrolled type 2 diabetes  mellitus with hyperglycemia ([place])  Goal of therapy: Less than 6.5 hemoglobin A1c. His diabetes has drastically improved from 11.5 3 months He has monitor  carbohydrates  rice, potatoes, breads, sugars, and pastas.   -     HgB A1c -     Glucose (CBG) -     metFORMIN (GLUCOPHAGE) 500 MG tablet; Take 1 tablet (500 mg total) by mouth 2 (two) times daily with a meal. -     insulin glargine (LANTUS SOLOSTAR) 100 UNIT/ML Solostar Pen; Inject 13 Units into the skin daily. -     Ambulatory referral to Ophthalmology  Essential hypertension Counseled on blood pressure goal of less than 130/80, low-sodium, DASH diet, medication compliance, 150 minutes of moderate intensity exercise per week.  Mixed hyperlipidemia  Continue statin atorvastatin 28m and monitor fatty fooodsd -     Lipid Panel  Medication refill -     lisinopril (ZESTRIL) 20 MG tablet; Take 1 tablet (20 mg total) by mouth daily. -     metFORMIN (GLUCOPHAGE) 500 MG tablet; Take 1 tablet (500 mg total) by mouth 2 (two) times daily with a meal. -     insulin glargine (LANTUS SOLOSTAR) 100 UNIT/ML Solostar Pen; Inject 13 Units into the skin daily.  Other orders -     Discontinue: atorvastatin (LIPITOR) 40 MG tablet; Take 1 tablet (40 mg total) by mouth daily.  Meds ordered this encounter  Medications  . lisinopril (ZESTRIL) 20 MG tablet    Sig: Take 1 tablet (20 mg total) by mouth daily.    Dispense:  90 tablet    Refill:  1  . metFORMIN (GLUCOPHAGE) 500 MG tablet    Sig: Take 1 tablet (500 mg total) by mouth 2 (two) times daily with a meal.    Dispense:  180 tablet    Refill:  1  . insulin glargine (LANTUS SOLOSTAR) 100 UNIT/ML Solostar Pen    Sig: Inject 13 Units into the skin daily.    Dispense:  15 mL    Refill:  PRN  . DISCONTD: atorvastatin (LIPITOR) 40 MG tablet    Sig: Take 1 tablet (40 mg total) by mouth daily.     Dispense:  30 tablet    Refill:  0    Follow-up:  3 months Bp/ diabetes   MKerin Perna NP

## 2020-05-16 ENCOUNTER — Encounter (INDEPENDENT_AMBULATORY_CARE_PROVIDER_SITE_OTHER): Payer: Self-pay | Admitting: Primary Care

## 2020-09-07 ENCOUNTER — Ambulatory Visit (INDEPENDENT_AMBULATORY_CARE_PROVIDER_SITE_OTHER): Payer: Self-pay | Admitting: Primary Care

## 2021-09-20 IMAGING — CR DG CHEST 2V
2 series · 2 of 2 positions shown · non-contrast
Comparison: None.

CLINICAL DATA: Chest pain

EXAM:
CHEST - 2 VIEW

[chest pa]
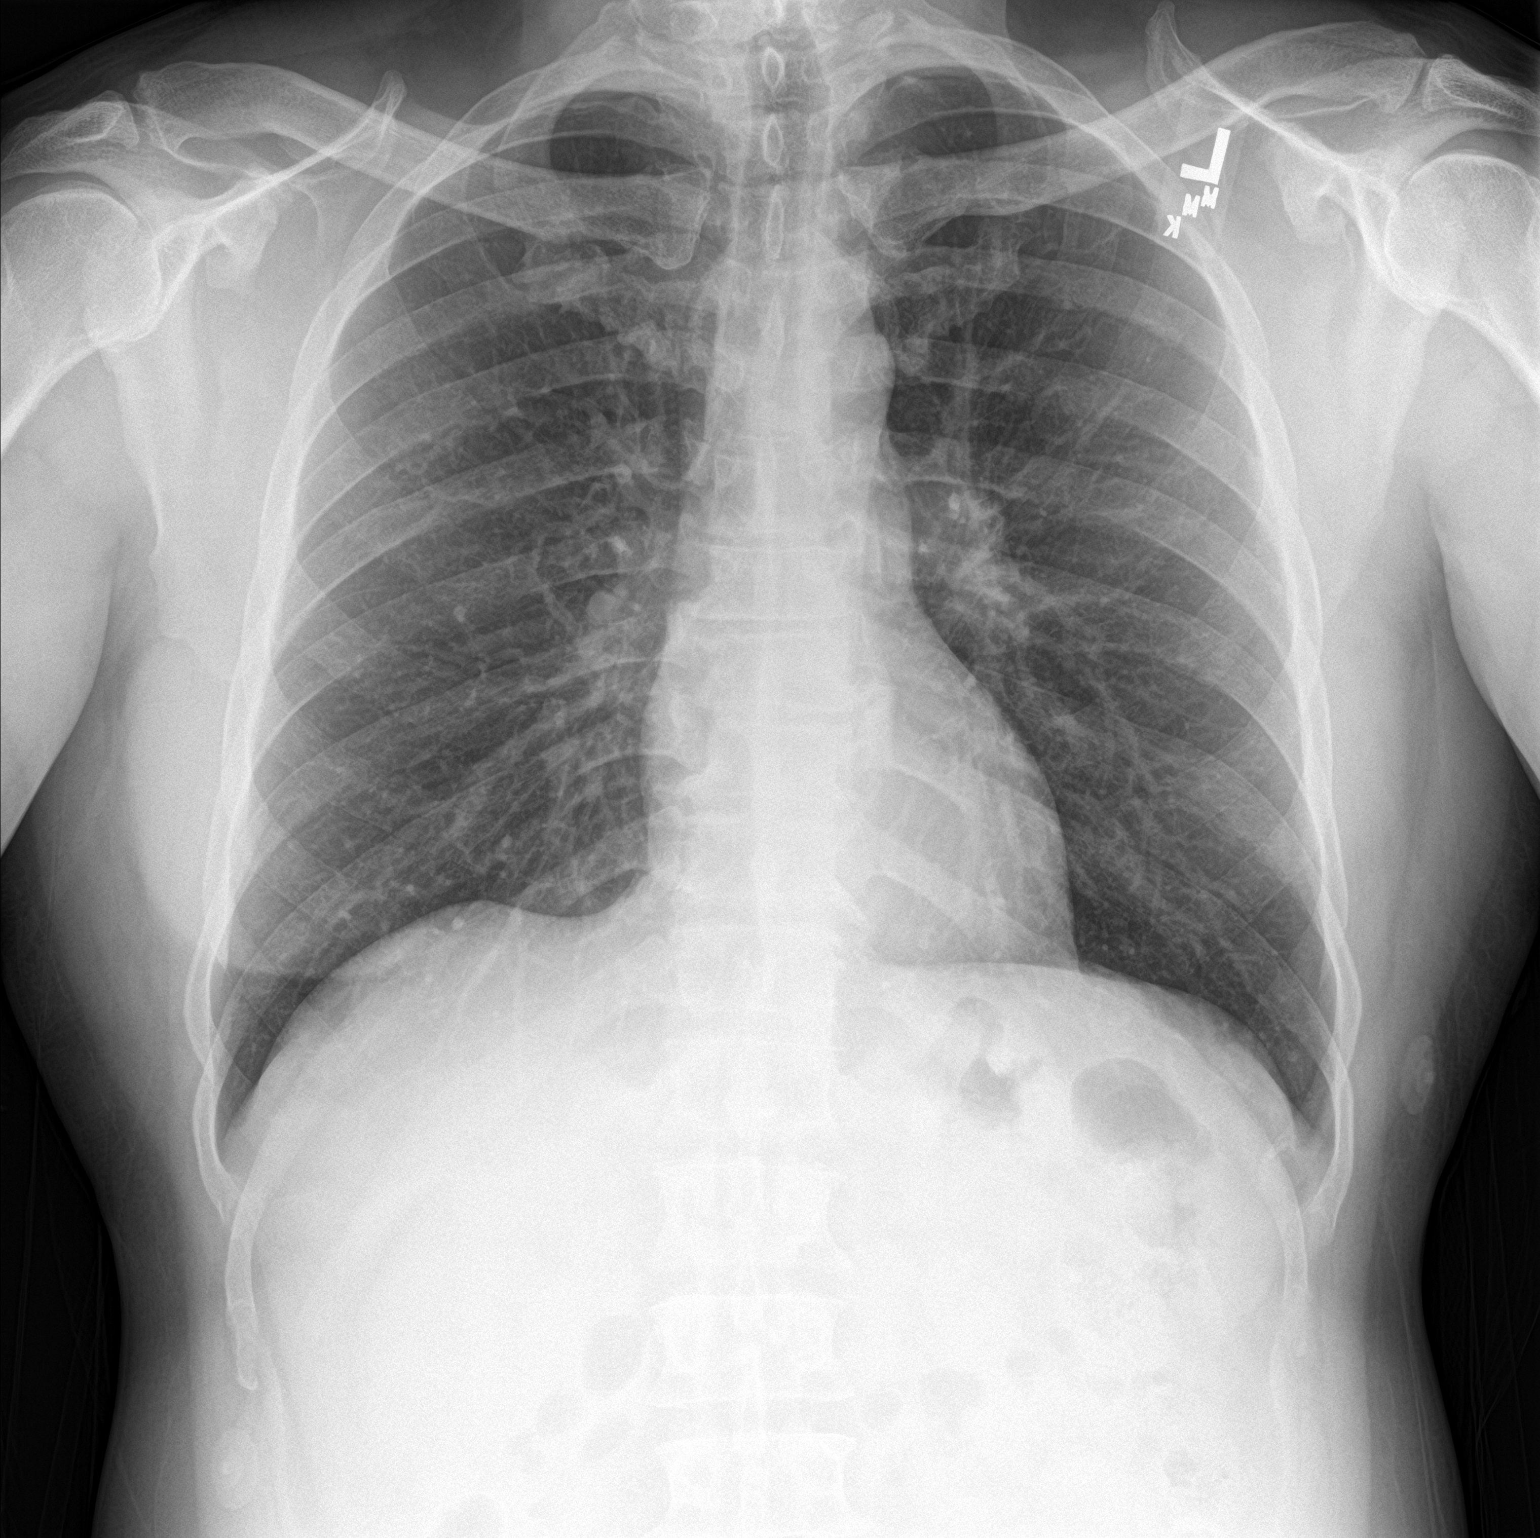

[chest lat]
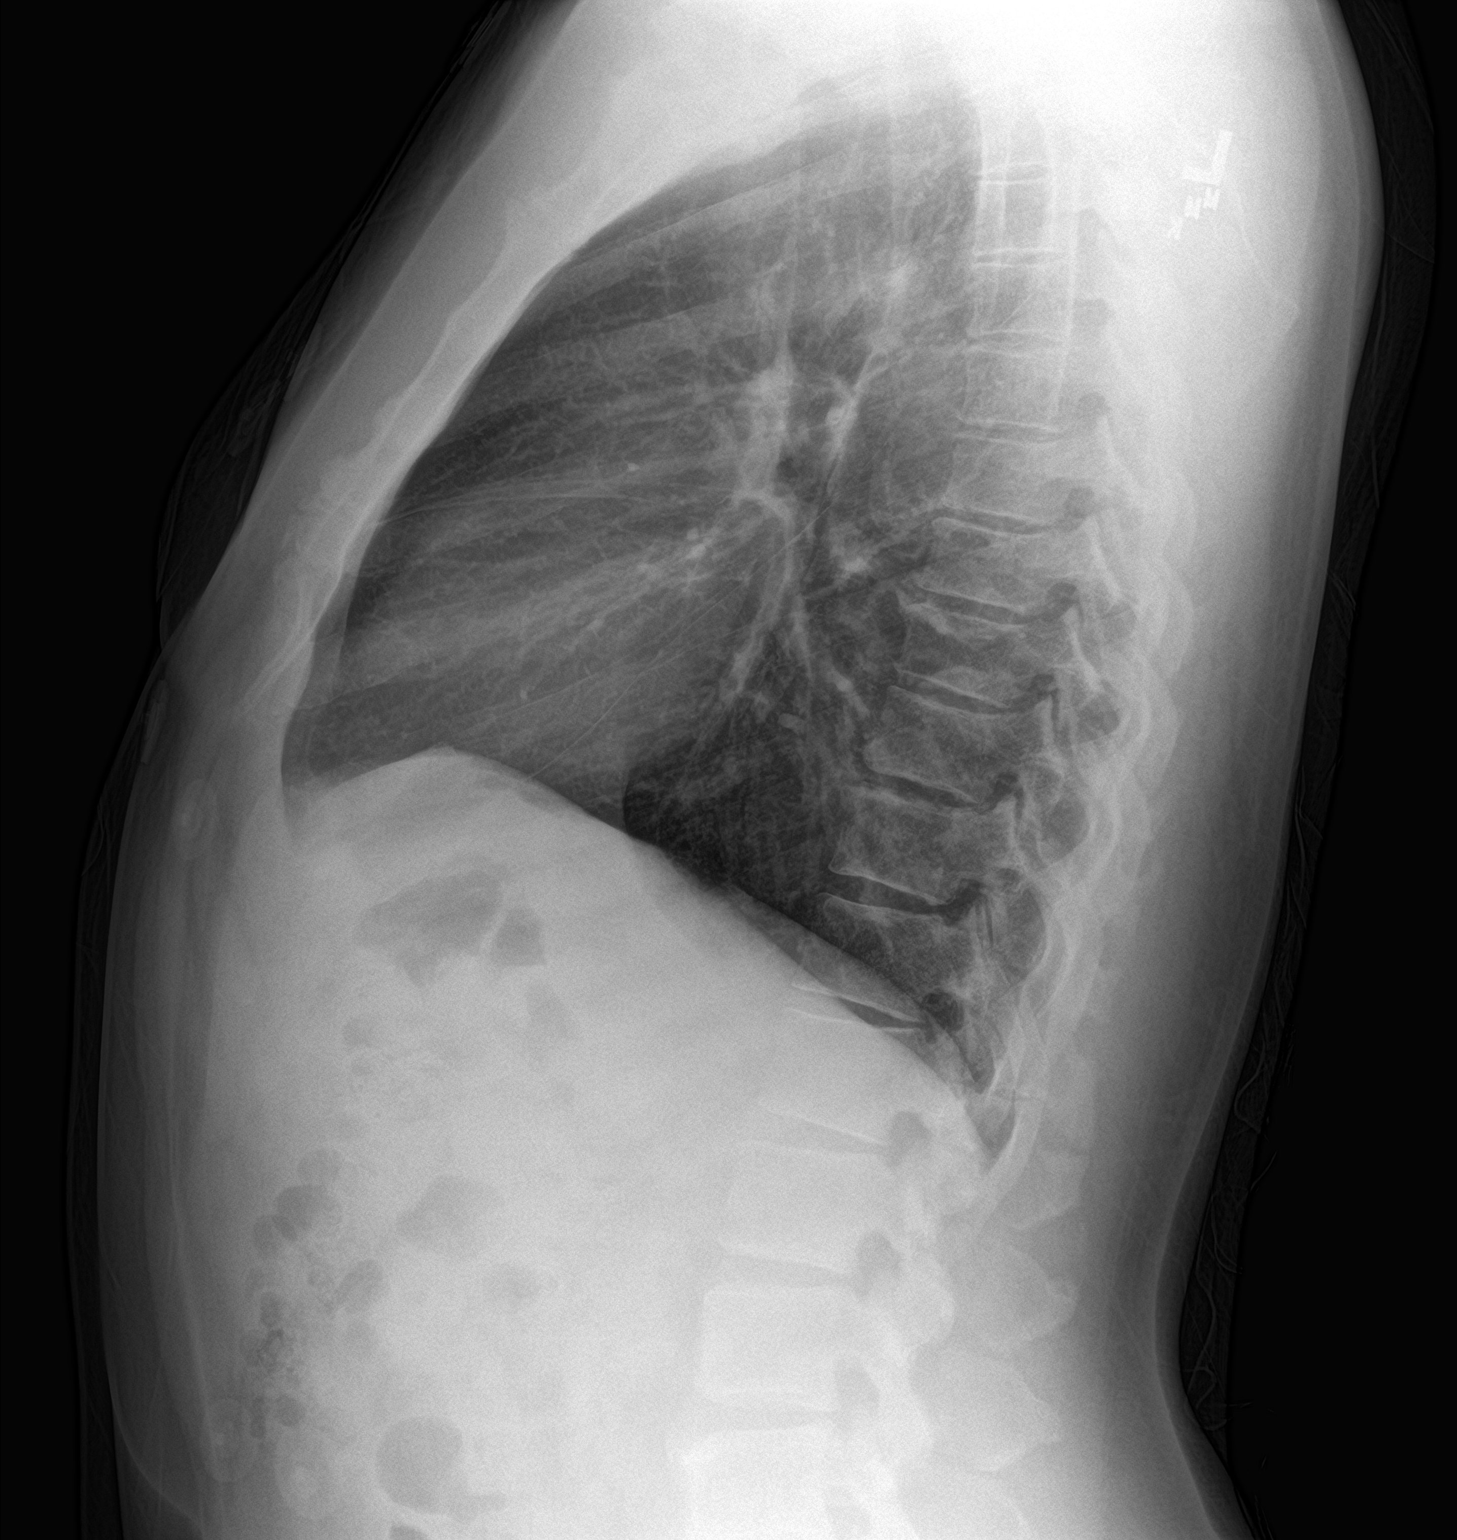

[2 of 2 positions shown; findings below may reference images not displayed]

FINDINGS: The heart size and mediastinal contours are within normal limits.
Both lungs are clear. No pleural effusion. The visualized skeletal
structures are unremarkable.
IMPRESSION: No acute process in the chest.
